# Patient Record
Sex: Female | Born: 1935 | Race: White | Hispanic: No | Marital: Single
Health system: Southern US, Community
[De-identification: ages and names within clinical notes are randomized; demographics above are authoritative.]

---

## 2010-04-07 ENCOUNTER — Ambulatory Visit: Payer: Self-pay

## 2010-04-09 ENCOUNTER — Ambulatory Visit: Payer: Self-pay | Admitting: Oncology

## 2010-04-11 ENCOUNTER — Ambulatory Visit: Payer: Self-pay | Admitting: Oncology

## 2010-04-14 ENCOUNTER — Ambulatory Visit: Payer: Self-pay | Admitting: General Surgery

## 2010-04-15 ENCOUNTER — Ambulatory Visit: Payer: Self-pay | Admitting: Oncology

## 2010-04-23 ENCOUNTER — Ambulatory Visit: Payer: Self-pay | Admitting: Oncology

## 2010-04-28 LAB — PATHOLOGY REPORT

## 2010-05-11 ENCOUNTER — Ambulatory Visit: Payer: Self-pay | Admitting: Oncology

## 2010-06-11 ENCOUNTER — Ambulatory Visit: Payer: Self-pay | Admitting: Oncology

## 2010-07-07 ENCOUNTER — Ambulatory Visit: Payer: Self-pay | Admitting: General Surgery

## 2010-07-11 ENCOUNTER — Ambulatory Visit: Payer: Self-pay | Admitting: Oncology

## 2010-08-11 ENCOUNTER — Ambulatory Visit: Payer: Self-pay | Admitting: Oncology

## 2010-08-17 ENCOUNTER — Ambulatory Visit: Payer: Self-pay | Admitting: Oncology

## 2010-09-11 ENCOUNTER — Ambulatory Visit: Payer: Self-pay | Admitting: Oncology

## 2010-10-11 ENCOUNTER — Ambulatory Visit: Payer: Self-pay | Admitting: Oncology

## 2010-11-11 ENCOUNTER — Ambulatory Visit: Payer: Self-pay | Admitting: Oncology

## 2010-12-23 ENCOUNTER — Ambulatory Visit: Payer: Self-pay | Admitting: Oncology

## 2011-01-11 ENCOUNTER — Ambulatory Visit: Payer: Self-pay | Admitting: Oncology

## 2011-01-18 ENCOUNTER — Ambulatory Visit: Payer: Self-pay | Admitting: Urology

## 2011-02-15 ENCOUNTER — Ambulatory Visit: Payer: Self-pay | Admitting: Oncology

## 2011-02-15 LAB — COMPREHENSIVE METABOLIC PANEL
Alkaline Phosphatase: 170 U/L — ABNORMAL HIGH (ref 50–136)
BUN: 8 mg/dL (ref 7–18)
Bilirubin,Total: 0.5 mg/dL (ref 0.2–1.0)
Chloride: 92 mmol/L — ABNORMAL LOW (ref 98–107)
Co2: 26 mmol/L (ref 21–32)
Creatinine: 0.76 mg/dL (ref 0.60–1.30)
EGFR (African American): >60
EGFR (Non-African Amer.): >60
Potassium: 4.2 mmol/L (ref 3.5–5.1)
SGOT(AST): 26 U/L (ref 15–37)
SGPT (ALT): 35 U/L
Total Protein: 7.9 g/dL (ref 6.4–8.2)

## 2011-02-15 LAB — CBC CANCER CENTER
Basophil #: 0 x10 3/mm (ref 0.0–0.1)
Eosinophil #: 0.1 x10 3/mm (ref 0.0–0.7)
Eosinophil %: 2.8 %
HGB: 12.6 g/dL (ref 12.0–16.0)
Lymphocyte #: 0.5 x10 3/mm — ABNORMAL LOW (ref 1.0–3.6)
MCHC: 35.3 g/dL (ref 32.0–36.0)
Monocyte #: 0.1 x10 3/mm (ref 0.0–0.7)
Monocyte %: 6.3 %
Neutrophil #: 1.1 x10 3/mm — ABNORMAL LOW (ref 1.4–6.5)
Neutrophil %: 60.6 %
Platelet: 314 x10 3/mm (ref 150–440)
RBC: 4.35 10*6/uL (ref 3.80–5.20)
WBC: 1.8 x10 3/mm — CL (ref 3.6–11.0)

## 2011-02-17 LAB — CBC CANCER CENTER
Basophil %: 0.8 %
Eosinophil %: 2.3 %
HCT: 33.5 % — ABNORMAL LOW (ref 35.0–47.0)
HGB: 11.8 g/dL — ABNORMAL LOW (ref 12.0–16.0)
Lymphocyte %: 28.6 %
MCHC: 35.2 g/dL (ref 32.0–36.0)
Monocyte %: 7.2 %
Neutrophil #: 1.3 x10 3/mm — ABNORMAL LOW (ref 1.4–6.5)

## 2011-02-19 ENCOUNTER — Inpatient Hospital Stay: Payer: Self-pay | Admitting: Internal Medicine

## 2011-02-19 LAB — COMPREHENSIVE METABOLIC PANEL
Alkaline Phosphatase: 104 U/L (ref 50–136)
Anion Gap: 10 (ref 7–16)
BUN: 6 mg/dL — ABNORMAL LOW (ref 7–18)
Bilirubin,Total: 0.4 mg/dL (ref 0.2–1.0)
Calcium, Total: 9.5 mg/dL (ref 8.5–10.1)
Chloride: 99 mmol/L (ref 98–107)
Co2: 26 mmol/L (ref 21–32)
Potassium: 4 mmol/L (ref 3.5–5.1)
SGOT(AST): 20 U/L (ref 15–37)
SGPT (ALT): 22 U/L
Total Protein: 6.6 g/dL (ref 6.4–8.2)

## 2011-02-19 LAB — CBC WITH DIFFERENTIAL/PLATELET
Basophil #: 0 10*3/uL (ref 0.0–0.1)
Eosinophil #: 0 10*3/uL (ref 0.0–0.7)
Eosinophil %: 0 %
HCT: 29.8 % — ABNORMAL LOW (ref 35.0–47.0)
Lymphocyte #: 0.3 10*3/uL — ABNORMAL LOW (ref 1.0–3.6)
MCHC: 34.4 g/dL (ref 32.0–36.0)
MCV: 83 fL (ref 80–100)
Monocyte #: 0.2 10*3/uL (ref 0.0–0.7)
Neutrophil #: 0.9 10*3/uL — ABNORMAL LOW (ref 1.4–6.5)
Neutrophil %: 66.3 %
RDW: 12.9 % (ref 11.5–14.5)
WBC: 1.4 10*3/uL — CL (ref 3.6–11.0)

## 2011-02-19 LAB — URINALYSIS, COMPLETE
Blood: NEGATIVE
Ketone: NEGATIVE
Nitrite: NEGATIVE
Ph: 6 (ref 4.5–8.0)
Protein: NEGATIVE
RBC,UR: NONE SEEN /HPF (ref 0–5)
Specific Gravity: 1.001 (ref 1.003–1.030)

## 2011-02-20 LAB — CBC WITH DIFFERENTIAL/PLATELET
Basophil %: 1.7 %
Eosinophil #: 0.1 10*3/uL (ref 0.0–0.7)
Eosinophil %: 4.7 %
HGB: 9.6 g/dL — ABNORMAL LOW (ref 12.0–16.0)
Lymphocyte #: 0.4 10*3/uL — ABNORMAL LOW (ref 1.0–3.6)
MCH: 28.4 pg (ref 26.0–34.0)
MCHC: 34.2 g/dL (ref 32.0–36.0)
MCV: 83 fL (ref 80–100)
Monocyte #: 0.1 10*3/uL (ref 0.0–0.7)
Monocyte %: 10.9 %
Neutrophil %: 49 %
Platelet: 236 10*3/uL (ref 150–440)
RBC: 3.4 10*6/uL — ABNORMAL LOW (ref 3.80–5.20)
RDW: 12.9 % (ref 11.5–14.5)
WBC: 1.3 10*3/uL — CL (ref 3.6–11.0)

## 2011-02-20 LAB — URINE CULTURE

## 2011-02-21 LAB — CBC WITH DIFFERENTIAL/PLATELET
Basophil %: 1.3 %
Basophil: 5 %
Eosinophil #: 0.1 10*3/uL (ref 0.0–0.7)
Eosinophil %: 7 %
HCT: 25.5 % — ABNORMAL LOW (ref 35.0–47.0)
Lymphocyte %: 29.9 %
Lymphocytes: 29 %
MCH: 28.5 pg (ref 26.0–34.0)
MCHC: 34.4 g/dL (ref 32.0–36.0)
MCV: 83 fL (ref 80–100)
Monocyte #: 0.1 10*3/uL (ref 0.0–0.7)
Neutrophil #: 0.5 10*3/uL — ABNORMAL LOW (ref 1.4–6.5)
RDW: 12.6 % (ref 11.5–14.5)

## 2011-02-22 LAB — CBC WITH DIFFERENTIAL/PLATELET
Basophil #: 0 10*3/uL (ref 0.0–0.1)
Basophil %: 1.5 %
Eosinophil #: 0.1 10*3/uL (ref 0.0–0.7)
HCT: 28 % — ABNORMAL LOW (ref 35.0–47.0)
HGB: 9.7 g/dL — ABNORMAL LOW (ref 12.0–16.0)
Lymphocyte %: 35.9 %
MCH: 28.5 pg (ref 26.0–34.0)
MCHC: 34.5 g/dL (ref 32.0–36.0)
Monocyte #: 0.1 10*3/uL (ref 0.0–0.7)
Monocyte %: 9.7 %
Neutrophil %: 47.2 %
Platelet: 244 10*3/uL (ref 150–440)
RBC: 3.38 10*6/uL — ABNORMAL LOW (ref 3.80–5.20)

## 2011-02-22 LAB — PROTIME-INR
INR: 1.1
Prothrombin Time: 14.8 secs — ABNORMAL HIGH (ref 11.5–14.7)

## 2011-02-22 LAB — CREATININE, SERUM
EGFR (African American): >60
EGFR (Non-African Amer.): >60

## 2011-02-23 LAB — CBC WITH DIFFERENTIAL/PLATELET
Basophil #: 0 10*3/uL (ref 0.0–0.1)
Eosinophil #: 0.1 10*3/uL (ref 0.0–0.7)
MCH: 28.2 pg (ref 26.0–34.0)
MCHC: 34.4 g/dL (ref 32.0–36.0)
Monocyte #: 0.1 10*3/uL (ref 0.0–0.7)
Neutrophil %: 48.6 %
Platelet: 260 10*3/uL (ref 150–440)
RDW: 12.9 % (ref 11.5–14.5)
WBC: 1.2 10*3/uL — CL (ref 3.6–11.0)

## 2011-02-23 LAB — CLOSTRIDIUM DIFFICILE BY PCR

## 2011-02-23 LAB — PATHOLOGY REPORT

## 2011-02-23 LAB — WBCS, STOOL

## 2011-02-24 LAB — COMPREHENSIVE METABOLIC PANEL
Alkaline Phosphatase: 88 U/L (ref 50–136)
Anion Gap: 12 (ref 7–16)
Chloride: 107 mmol/L (ref 98–107)
Co2: 24 mmol/L (ref 21–32)
Creatinine: 0.65 mg/dL (ref 0.60–1.30)
EGFR (African American): >60
EGFR (Non-African Amer.): >60
Osmolality: 283 (ref 275–301)
Sodium: 143 mmol/L (ref 136–145)

## 2011-02-24 LAB — CBC WITH DIFFERENTIAL/PLATELET
Eosinophil #: 0.1 10*3/uL (ref 0.0–0.7)
Eosinophil %: 5.7 %
Lymphocyte #: 0.3 10*3/uL — ABNORMAL LOW (ref 1.0–3.6)
MCHC: 34.1 g/dL (ref 32.0–36.0)
MCV: 82 fL (ref 80–100)
Monocyte %: 14 %
Platelet: 266 10*3/uL (ref 150–440)
RBC: 3.54 10*6/uL — ABNORMAL LOW (ref 3.80–5.20)
RDW: 12.6 % (ref 11.5–14.5)
WBC: 1.1 10*3/uL — CL (ref 3.6–11.0)

## 2011-02-25 LAB — CBC WITH DIFFERENTIAL/PLATELET
Basophil #: 0 10*3/uL (ref 0.0–0.1)
Eosinophil #: 0.1 10*3/uL (ref 0.0–0.7)
HCT: 29.1 % — ABNORMAL LOW (ref 35.0–47.0)
HGB: 9.9 g/dL — ABNORMAL LOW (ref 12.0–16.0)
Lymphocyte #: 0.4 10*3/uL — ABNORMAL LOW (ref 1.0–3.6)
Lymphocyte %: 30.7 %
MCH: 28.1 pg (ref 26.0–34.0)
MCHC: 34.1 g/dL (ref 32.0–36.0)
MCV: 83 fL (ref 80–100)
Monocyte #: 0.2 10*3/uL (ref 0.0–0.7)
Monocyte %: 13.9 %
Neutrophil #: 0.6 10*3/uL — ABNORMAL LOW (ref 1.4–6.5)
RBC: 3.52 10*6/uL — ABNORMAL LOW (ref 3.80–5.20)
WBC: 1.3 10*3/uL — CL (ref 3.6–11.0)

## 2011-02-27 LAB — CBC WITH DIFFERENTIAL/PLATELET
Basophil #: 0 10*3/uL (ref 0.0–0.1)
Eosinophil #: 0.1 10*3/uL (ref 0.0–0.7)
Eosinophil %: 4.9 %
HCT: 29.2 % — ABNORMAL LOW (ref 35.0–47.0)
Lymphocyte #: 0.3 10*3/uL — ABNORMAL LOW (ref 1.0–3.6)
Lymphocyte %: 21.8 %
MCHC: 33.5 g/dL (ref 32.0–36.0)
MCV: 83 fL (ref 80–100)
Neutrophil #: 0.8 10*3/uL — ABNORMAL LOW (ref 1.4–6.5)
Neutrophil %: 56.1 %
Platelet: 214 10*3/uL (ref 150–440)
RBC: 3.53 10*6/uL — ABNORMAL LOW (ref 3.80–5.20)
RDW: 13.2 % (ref 11.5–14.5)

## 2011-02-27 LAB — CK TOTAL AND CKMB (NOT AT ARMC)
CK, Total: 14 U/L — ABNORMAL LOW (ref 21–215)
CK, Total: 13 U/L — ABNORMAL LOW (ref 21–215)
CK-MB: <0.5 ng/mL — ABNORMAL LOW (ref 0.5–3.6)
CK-MB: <0.5 ng/mL — ABNORMAL LOW (ref 0.5–3.6)

## 2011-02-27 LAB — TROPONIN I: Troponin-I: <0.02 ng/mL

## 2011-02-27 LAB — COMPREHENSIVE METABOLIC PANEL
Albumin: 2.9 g/dL — ABNORMAL LOW (ref 3.4–5.0)
Alkaline Phosphatase: 79 U/L (ref 50–136)
Anion Gap: 9 (ref 7–16)
BUN: 10 mg/dL (ref 7–18)
Calcium, Total: 9.7 mg/dL (ref 8.5–10.1)
Glucose: 90 mg/dL (ref 65–99)
Potassium: 3.8 mmol/L (ref 3.5–5.1)
SGOT(AST): 22 U/L (ref 15–37)
SGPT (ALT): 25 U/L
Sodium: 139 mmol/L (ref 136–145)

## 2011-02-28 LAB — CBC WITH DIFFERENTIAL/PLATELET
Basophil #: 0 10*3/uL (ref 0.0–0.1)
Basophil %: 0.9 %
Eosinophil #: 0.1 10*3/uL (ref 0.0–0.7)
HCT: 29.5 % — ABNORMAL LOW (ref 35.0–47.0)
Lymphocyte #: 0.4 10*3/uL — ABNORMAL LOW (ref 1.0–3.6)
MCH: 28 pg (ref 26.0–34.0)
MCHC: 33.6 g/dL (ref 32.0–36.0)
MCV: 83 fL (ref 80–100)
Monocyte #: 0.2 10*3/uL (ref 0.0–0.7)
Monocyte %: 21 %
Neutrophil #: 0.5 10*3/uL — ABNORMAL LOW (ref 1.4–6.5)
Platelet: 200 10*3/uL (ref 150–440)
RDW: 13.7 % (ref 11.5–14.5)

## 2011-02-28 LAB — CK TOTAL AND CKMB (NOT AT ARMC)
CK, Total: 12 U/L — ABNORMAL LOW (ref 21–215)
CK-MB: <0.5 ng/mL — ABNORMAL LOW (ref 0.5–3.6)

## 2011-02-28 LAB — TROPONIN I: Troponin-I: <0.02 ng/mL

## 2011-03-02 LAB — CBC CANCER CENTER
Bands: 12 %
Blast: 1 %
Eosinophil: 1 %
HCT: 31.8 % — ABNORMAL LOW (ref 35.0–47.0)
HGB: 11.1 g/dL — ABNORMAL LOW (ref 12.0–16.0)
MCH: 28.6 pg (ref 26.0–34.0)
MCHC: 34.7 g/dL (ref 32.0–36.0)
Metamyelocyte: 4 %
Monocytes: 30 %
Segmented Neutrophils: 18 %
Variant Lymphocyte: 4 %

## 2011-03-02 LAB — COMPREHENSIVE METABOLIC PANEL
Alkaline Phosphatase: 126 U/L (ref 50–136)
BUN: 7 mg/dL (ref 7–18)
Bilirubin,Total: 0.3 mg/dL (ref 0.2–1.0)
Creatinine: 0.93 mg/dL (ref 0.60–1.30)
EGFR (African American): >60
EGFR (Non-African Amer.): >60
Glucose: 98 mg/dL (ref 65–99)
Sodium: 136 mmol/L (ref 136–145)
Total Protein: 6.7 g/dL (ref 6.4–8.2)

## 2011-03-09 LAB — CBC CANCER CENTER
Basophil %: 1.5 %
Eosinophil %: 1.5 %
HCT: 31.7 % — ABNORMAL LOW (ref 35.0–47.0)
HGB: 11 g/dL — ABNORMAL LOW (ref 12.0–16.0)
Lymphocyte %: 14.3 %
MCH: 28.1 pg (ref 26.0–34.0)
MCV: 81 fL (ref 80–100)
Monocyte #: 0.2 x10 3/mm (ref 0.0–0.7)
Neutrophil %: 73.2 %
RBC: 3.91 10*6/uL (ref 3.80–5.20)
WBC: 2.3 x10 3/mm — ABNORMAL LOW (ref 3.6–11.0)

## 2011-03-09 LAB — COMPREHENSIVE METABOLIC PANEL
Albumin: 3.3 g/dL — ABNORMAL LOW (ref 3.4–5.0)
Alkaline Phosphatase: 141 U/L — ABNORMAL HIGH (ref 50–136)
Anion Gap: 14 (ref 7–16)
BUN: 6 mg/dL — ABNORMAL LOW (ref 7–18)
Bilirubin,Total: 0.3 mg/dL (ref 0.2–1.0)
Calcium, Total: 9.1 mg/dL (ref 8.5–10.1)
Chloride: 103 mmol/L (ref 98–107)
Co2: 22 mmol/L (ref 21–32)
EGFR (African American): >60
Glucose: 91 mg/dL (ref 65–99)
Osmolality: 275 (ref 275–301)
Potassium: 3.5 mmol/L (ref 3.5–5.1)
SGOT(AST): 28 U/L (ref 15–37)
SGPT (ALT): 28 U/L
Sodium: 139 mmol/L (ref 136–145)
Total Protein: 7.1 g/dL (ref 6.4–8.2)

## 2011-03-11 ENCOUNTER — Ambulatory Visit: Payer: Self-pay | Admitting: Oncology

## 2011-03-16 LAB — BASIC METABOLIC PANEL
Calcium, Total: 9.1 mg/dL (ref 8.5–10.1)
Co2: 23 mmol/L (ref 21–32)
EGFR (African American): >60
EGFR (Non-African Amer.): >60
Glucose: 106 mg/dL — ABNORMAL HIGH (ref 65–99)
Osmolality: 270 (ref 275–301)
Sodium: 136 mmol/L (ref 136–145)

## 2011-03-16 LAB — CBC CANCER CENTER
Basophil #: 0 x10 3/mm (ref 0.0–0.1)
Basophil %: 2 %
Eosinophil #: 0 x10 3/mm (ref 0.0–0.7)
Eosinophil %: 1.8 %
HCT: 30.8 % — ABNORMAL LOW (ref 35.0–47.0)
HGB: 10.7 g/dL — ABNORMAL LOW (ref 12.0–16.0)
Lymphocyte #: 0.3 x10 3/mm — ABNORMAL LOW (ref 1.0–3.6)
Lymphocyte %: 23.9 %
MCH: 28.1 pg (ref 26.0–34.0)
MCHC: 34.9 g/dL (ref 32.0–36.0)
Monocyte #: 0.2 x10 3/mm (ref 0.0–0.7)
Monocyte %: 11.7 %
Neutrophil #: 0.9 x10 3/mm — ABNORMAL LOW (ref 1.4–6.5)
Platelet: 83 x10 3/mm — ABNORMAL LOW (ref 150–440)
RDW: 14.1 % (ref 11.5–14.5)
WBC: 1.4 x10 3/mm — CL (ref 3.6–11.0)

## 2011-03-23 LAB — CBC CANCER CENTER
Basophil %: 1.8 %
Eosinophil #: 0.1 x10 3/mm (ref 0.0–0.7)
HGB: 9.8 g/dL — ABNORMAL LOW (ref 12.0–16.0)
Lymphocyte #: 0.3 x10 3/mm — ABNORMAL LOW (ref 1.0–3.6)
MCH: 27.8 pg (ref 26.0–34.0)
MCHC: 34.9 g/dL (ref 32.0–36.0)
MCV: 80 fL (ref 80–100)
Monocyte #: 0.2 x10 3/mm (ref 0.0–0.7)
Neutrophil #: 0.4 x10 3/mm — ABNORMAL LOW (ref 1.4–6.5)
RBC: 3.52 10*6/uL — ABNORMAL LOW (ref 3.80–5.20)
WBC: 1 x10 3/mm — CL (ref 3.6–11.0)

## 2011-03-30 LAB — CBC CANCER CENTER
Basophil %: 0.8 %
Eosinophil %: 2.2 %
HCT: 26.9 % — ABNORMAL LOW (ref 35.0–47.0)
HGB: 9.2 g/dL — ABNORMAL LOW (ref 12.0–16.0)
Lymphocyte %: 20.5 %
MCHC: 34.1 g/dL (ref 32.0–36.0)
MCV: 79 fL — ABNORMAL LOW (ref 80–100)
Neutrophil %: 62.3 %
RBC: 3.4 10*6/uL — ABNORMAL LOW (ref 3.80–5.20)

## 2011-04-11 ENCOUNTER — Ambulatory Visit: Payer: Self-pay | Admitting: Oncology

## 2011-04-18 LAB — CBC CANCER CENTER
Basophil #: 0 x10 3/mm (ref 0.0–0.1)
Eosinophil #: 0 x10 3/mm (ref 0.0–0.7)
Eosinophil %: 3.7 %
HCT: 29.6 % — ABNORMAL LOW (ref 35.0–47.0)
MCH: 27.1 pg (ref 26.0–34.0)
MCV: 79 fL — ABNORMAL LOW (ref 80–100)
Monocyte %: 21.5 %
Neutrophil #: 0.5 x10 3/mm — ABNORMAL LOW (ref 1.4–6.5)
Neutrophil %: 36.5 %
RBC: 3.77 10*6/uL — ABNORMAL LOW (ref 3.80–5.20)
RDW: 16.8 % — ABNORMAL HIGH (ref 11.5–14.5)
WBC: 1.3 x10 3/mm — CL (ref 3.6–11.0)

## 2011-04-25 LAB — CBC CANCER CENTER
Basophil #: 0 x10 3/mm (ref 0.0–0.1)
Basophil %: 2.1 %
Eosinophil #: 0 x10 3/mm (ref 0.0–0.7)
Eosinophil %: 2.4 %
Lymphocyte #: 0.4 x10 3/mm — ABNORMAL LOW (ref 1.0–3.6)
Lymphocyte %: 30.8 %
MCHC: 33.1 g/dL (ref 32.0–36.0)
MCV: 79 fL — ABNORMAL LOW (ref 80–100)
Monocyte #: 0.3 x10 3/mm (ref 0.2–0.9)
Monocyte %: 20.7 %
Neutrophil %: 44 %
RBC: 3.72 10*6/uL — ABNORMAL LOW (ref 3.80–5.20)

## 2011-05-02 LAB — CBC CANCER CENTER
Basophil #: 0 x10 3/mm (ref 0.0–0.1)
Eosinophil %: 2 %
HCT: 28.3 % — ABNORMAL LOW (ref 35.0–47.0)
HGB: 9.4 g/dL — ABNORMAL LOW (ref 12.0–16.0)
Lymphocyte #: 0.3 x10 3/mm — ABNORMAL LOW (ref 1.0–3.6)
Lymphocyte %: 30.4 %
MCH: 25.6 pg — ABNORMAL LOW (ref 26.0–34.0)
MCHC: 33.1 g/dL (ref 32.0–36.0)
Monocyte %: 12.3 %
Neutrophil #: 0.5 x10 3/mm — ABNORMAL LOW (ref 1.4–6.5)
Platelet: 67 x10 3/mm — ABNORMAL LOW (ref 150–440)
RBC: 3.66 10*6/uL — ABNORMAL LOW (ref 3.80–5.20)
WBC: 0.9 x10 3/mm — CL (ref 3.6–11.0)

## 2011-05-09 LAB — CBC CANCER CENTER
Basophil #: 0 x10 3/mm (ref 0.0–0.1)
Basophil %: 1.2 %
Eosinophil %: 2.3 %
HGB: 9 g/dL — ABNORMAL LOW (ref 12.0–16.0)
MCH: 25.6 pg — ABNORMAL LOW (ref 26.0–34.0)
MCHC: 33.2 g/dL (ref 32.0–36.0)
MCV: 77 fL — ABNORMAL LOW (ref 80–100)
Monocyte #: 0.3 x10 3/mm (ref 0.2–0.9)
Monocyte %: 20.9 %
Neutrophil %: 51.1 %
Platelet: 223 x10 3/mm (ref 150–440)
RBC: 3.53 10*6/uL — ABNORMAL LOW (ref 3.80–5.20)
RDW: 17.2 % — ABNORMAL HIGH (ref 11.5–14.5)
WBC: 1.3 x10 3/mm — CL (ref 3.6–11.0)

## 2011-05-09 LAB — COMPREHENSIVE METABOLIC PANEL
Anion Gap: 11 (ref 7–16)
BUN: 11 mg/dL (ref 7–18)
Bilirubin,Total: 0.2 mg/dL (ref 0.2–1.0)
Calcium, Total: 9.6 mg/dL (ref 8.5–10.1)
Potassium: 4.2 mmol/L (ref 3.5–5.1)
Sodium: 135 mmol/L — ABNORMAL LOW (ref 136–145)
Total Protein: 6.4 g/dL (ref 6.4–8.2)

## 2011-05-11 ENCOUNTER — Ambulatory Visit: Payer: Self-pay | Admitting: Oncology

## 2011-05-16 LAB — COMPREHENSIVE METABOLIC PANEL
Alkaline Phosphatase: 114 U/L (ref 50–136)
Anion Gap: 9 (ref 7–16)
BUN: 9 mg/dL (ref 7–18)
Bilirubin,Total: 0.3 mg/dL (ref 0.2–1.0)
Calcium, Total: 8.6 mg/dL (ref 8.5–10.1)
Chloride: 99 mmol/L (ref 98–107)
Co2: 26 mmol/L (ref 21–32)
Creatinine: 0.63 mg/dL (ref 0.60–1.30)
EGFR (African American): >60
Glucose: 98 mg/dL (ref 65–99)
Osmolality: 267 (ref 275–301)
Potassium: 3.7 mmol/L (ref 3.5–5.1)
SGOT(AST): 27 U/L (ref 15–37)
Sodium: 134 mmol/L — ABNORMAL LOW (ref 136–145)
Total Protein: 6.2 g/dL — ABNORMAL LOW (ref 6.4–8.2)

## 2011-05-16 LAB — CBC CANCER CENTER
Basophil %: 1.5 %
Eosinophil #: 0 x10 3/mm (ref 0.0–0.7)
Eosinophil %: 1.1 %
Lymphocyte %: 21.1 %
MCHC: 32.6 g/dL (ref 32.0–36.0)
MCV: 78 fL — ABNORMAL LOW (ref 80–100)
Monocyte %: 21.3 %
Neutrophil %: 55 %
RDW: 18.5 % — ABNORMAL HIGH (ref 11.5–14.5)

## 2011-05-30 LAB — CBC CANCER CENTER
Basophil #: 0 x10 3/mm (ref 0.0–0.1)
Eosinophil %: 1 %
HCT: 31.2 % — ABNORMAL LOW (ref 35.0–47.0)
HGB: 10.1 g/dL — ABNORMAL LOW (ref 12.0–16.0)
Lymphocyte %: 10 %
MCH: 25.6 pg — ABNORMAL LOW (ref 26.0–34.0)
MCHC: 32.5 g/dL (ref 32.0–36.0)
MCV: 79 fL — ABNORMAL LOW (ref 80–100)
Monocyte #: 0.6 x10 3/mm (ref 0.2–0.9)
Monocyte %: 13.1 %
Neutrophil %: 75 %
Platelet: 208 x10 3/mm (ref 150–440)
RBC: 3.96 10*6/uL (ref 3.80–5.20)
RDW: 19.4 % — ABNORMAL HIGH (ref 11.5–14.5)
WBC: 4.9 x10 3/mm (ref 3.6–11.0)

## 2011-05-30 LAB — COMPREHENSIVE METABOLIC PANEL
BUN: 12 mg/dL (ref 7–18)
Calcium, Total: 9.3 mg/dL (ref 8.5–10.1)
Chloride: 100 mmol/L (ref 98–107)
Creatinine: 0.63 mg/dL (ref 0.60–1.30)
EGFR (African American): >60
EGFR (Non-African Amer.): >60
SGOT(AST): 23 U/L (ref 15–37)
SGPT (ALT): 37 U/L

## 2011-06-07 LAB — CBC CANCER CENTER
Basophil #: 0 x10 3/mm (ref 0.0–0.1)
Basophil %: 0.7 %
HGB: 11.1 g/dL — ABNORMAL LOW (ref 12.0–16.0)
Lymphocyte #: 0.5 x10 3/mm — ABNORMAL LOW (ref 1.0–3.6)
Lymphocyte %: 7.4 %
MCH: 26 pg (ref 26.0–34.0)
MCHC: 32.5 g/dL (ref 32.0–36.0)
MCV: 80 fL (ref 80–100)
Monocyte #: 1 x10 3/mm — ABNORMAL HIGH (ref 0.2–0.9)
Monocyte %: 12.9 %
Neutrophil %: 77.3 %
RBC: 4.25 10*6/uL (ref 3.80–5.20)
RDW: 19.4 % — ABNORMAL HIGH (ref 11.5–14.5)
WBC: 7.4 x10 3/mm (ref 3.6–11.0)

## 2011-06-07 LAB — COMPREHENSIVE METABOLIC PANEL
Alkaline Phosphatase: 111 U/L (ref 50–136)
Calcium, Total: 9.4 mg/dL (ref 8.5–10.1)
Chloride: 101 mmol/L (ref 98–107)
Creatinine: 0.78 mg/dL (ref 0.60–1.30)
EGFR (Non-African Amer.): >60
Osmolality: 275 (ref 275–301)
Potassium: 3.7 mmol/L (ref 3.5–5.1)
SGOT(AST): 21 U/L (ref 15–37)
Sodium: 137 mmol/L (ref 136–145)
Total Protein: 6.9 g/dL (ref 6.4–8.2)

## 2011-06-11 ENCOUNTER — Ambulatory Visit: Payer: Self-pay | Admitting: Oncology

## 2011-06-14 LAB — BASIC METABOLIC PANEL
BUN: 11 mg/dL (ref 7–18)
Calcium, Total: 9.2 mg/dL (ref 8.5–10.1)
Creatinine: 0.71 mg/dL (ref 0.60–1.30)
EGFR (African American): >60
Glucose: 135 mg/dL — ABNORMAL HIGH (ref 65–99)
Osmolality: 275 (ref 275–301)
Sodium: 137 mmol/L (ref 136–145)

## 2011-06-14 LAB — CBC CANCER CENTER
Basophil #: 0 x10 3/mm (ref 0.0–0.1)
Basophil %: 1.1 %
Eosinophil #: 0.1 x10 3/mm (ref 0.0–0.7)
Eosinophil %: 3.6 %
HCT: 34.1 % — ABNORMAL LOW (ref 35.0–47.0)
HGB: 11.2 g/dL — ABNORMAL LOW (ref 12.0–16.0)
Lymphocyte #: 0.5 x10 3/mm — ABNORMAL LOW (ref 1.0–3.6)
Lymphocyte %: 14.2 %
MCH: 26.1 pg (ref 26.0–34.0)
MCV: 80 fL (ref 80–100)
Monocyte #: 0.5 x10 3/mm (ref 0.2–0.9)
Platelet: 159 x10 3/mm (ref 150–440)
RBC: 4.28 10*6/uL (ref 3.80–5.20)
RDW: 19.4 % — ABNORMAL HIGH (ref 11.5–14.5)
WBC: 3.7 x10 3/mm (ref 3.6–11.0)

## 2011-06-28 LAB — CBC CANCER CENTER
Basophil #: 0 x10 3/mm (ref 0.0–0.1)
Basophil %: 0.8 %
Eosinophil %: 1.7 %
HGB: 10.8 g/dL — ABNORMAL LOW (ref 12.0–16.0)
MCHC: 32.5 g/dL (ref 32.0–36.0)
MCV: 79 fL — ABNORMAL LOW (ref 80–100)
RBC: 4.17 10*6/uL (ref 3.80–5.20)
RDW: 18.3 % — ABNORMAL HIGH (ref 11.5–14.5)

## 2011-06-28 LAB — COMPREHENSIVE METABOLIC PANEL
Albumin: 3.6 g/dL (ref 3.4–5.0)
Alkaline Phosphatase: 117 U/L (ref 50–136)
BUN: 7 mg/dL (ref 7–18)
Bilirubin,Total: 0.2 mg/dL (ref 0.2–1.0)
Calcium, Total: 9.2 mg/dL (ref 8.5–10.1)
Chloride: 103 mmol/L (ref 98–107)
Co2: 26 mmol/L (ref 21–32)
EGFR (Non-African Amer.): >60
Glucose: 96 mg/dL (ref 65–99)
Potassium: 3.6 mmol/L (ref 3.5–5.1)
SGPT (ALT): 37 U/L
Sodium: 138 mmol/L (ref 136–145)
Total Protein: 6.9 g/dL (ref 6.4–8.2)

## 2011-07-11 ENCOUNTER — Ambulatory Visit: Payer: Self-pay | Admitting: Oncology

## 2011-07-12 LAB — CBC CANCER CENTER
Basophil %: 1.3 %
HCT: 33.4 % — ABNORMAL LOW (ref 35.0–47.0)
HGB: 11 g/dL — ABNORMAL LOW (ref 12.0–16.0)
Lymphocyte %: 18.9 %
MCHC: 32.9 g/dL (ref 32.0–36.0)
MCV: 79 fL — ABNORMAL LOW (ref 80–100)
Monocyte #: 0.7 x10 3/mm (ref 0.2–0.9)
Monocyte %: 16.5 %
Neutrophil #: 2.4 x10 3/mm (ref 1.4–6.5)
Platelet: 166 x10 3/mm (ref 150–440)
WBC: 4 x10 3/mm (ref 3.6–11.0)

## 2011-07-12 LAB — COMPREHENSIVE METABOLIC PANEL
Anion Gap: 13 (ref 7–16)
BUN: 10 mg/dL (ref 7–18)
Bilirubin,Total: 0.3 mg/dL (ref 0.2–1.0)
Chloride: 101 mmol/L (ref 98–107)
Co2: 23 mmol/L (ref 21–32)
EGFR (African American): >60
EGFR (Non-African Amer.): >60
Potassium: 3.6 mmol/L (ref 3.5–5.1)
SGOT(AST): 25 U/L (ref 15–37)
Sodium: 137 mmol/L (ref 136–145)
Total Protein: 6.9 g/dL (ref 6.4–8.2)

## 2011-08-11 ENCOUNTER — Ambulatory Visit: Payer: Self-pay | Admitting: Oncology

## 2012-03-10 DEATH — deceased

## 2012-06-08 IMAGING — CT CT ABDOMEN WO/W CM
1 series · 15 of 32 positions shown, 19 images · non-contrast
Comparison: none

REASON FOR EXAM: rt renal mass
COMMENTS:

[Series 2: renal wo 3.0 i40f 3 · axial · 0.82mm/px · z∈[-53,+178]mm · 15 of 86 slices shown, 19 images]
[im 6/86  soft-tissue]
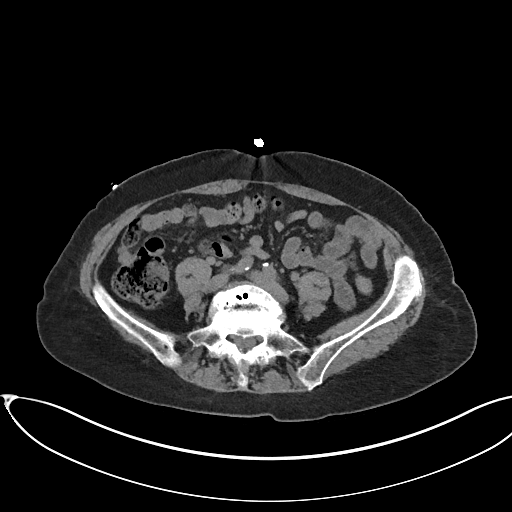
[im 6/86  bone]
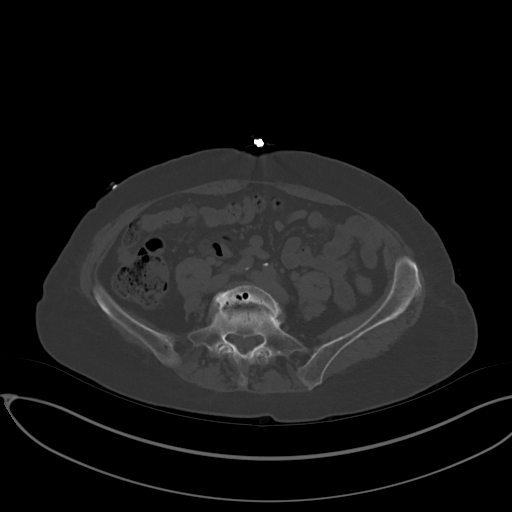
[im 11/86  soft-tissue]
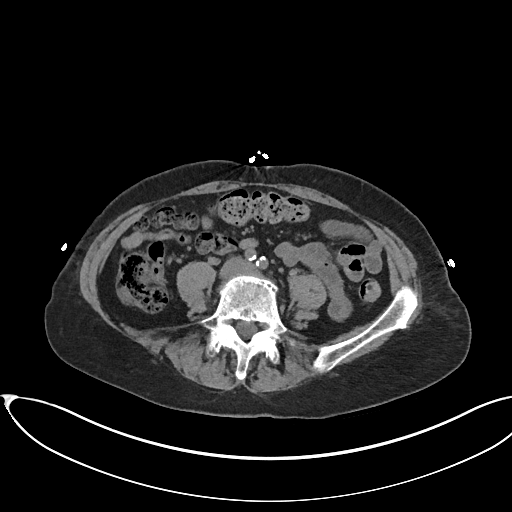
[im 17/86  soft-tissue]
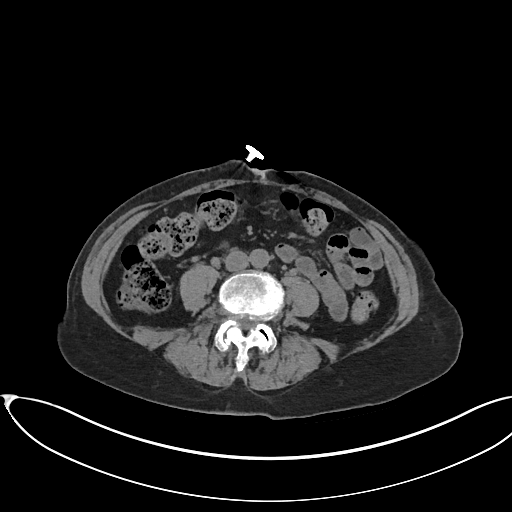
[im 25/86  soft-tissue]
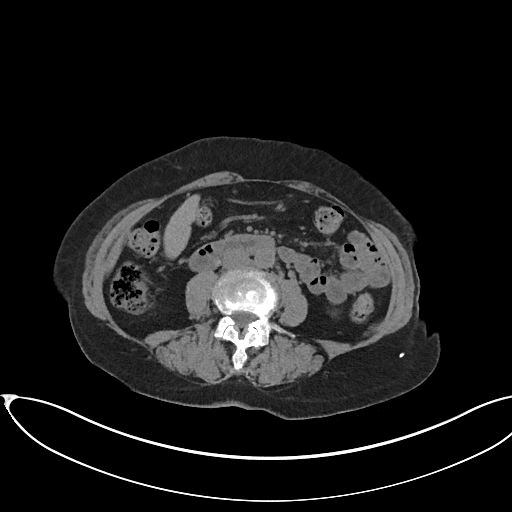
[im 31/86  soft-tissue]
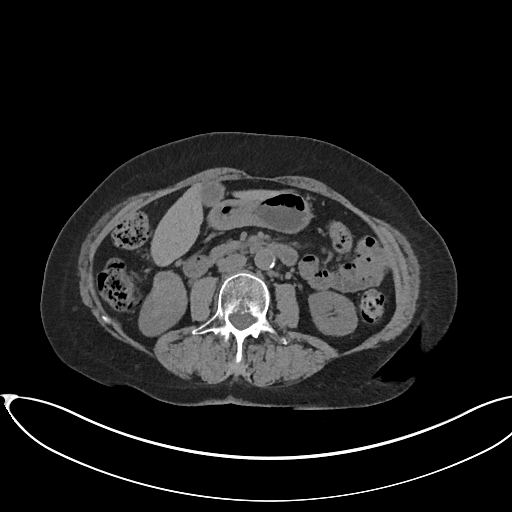
[im 36/86  soft-tissue]
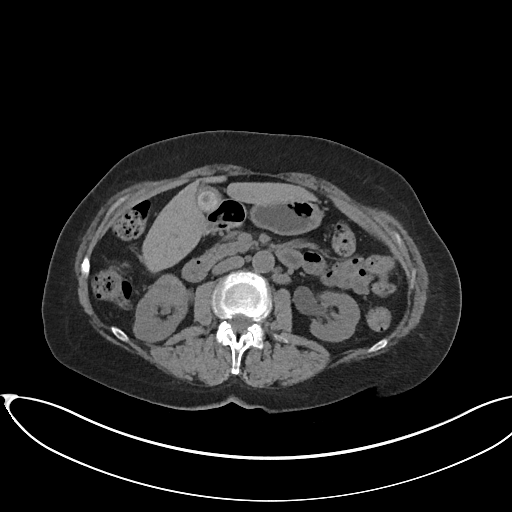
[im 44/86  soft-tissue]
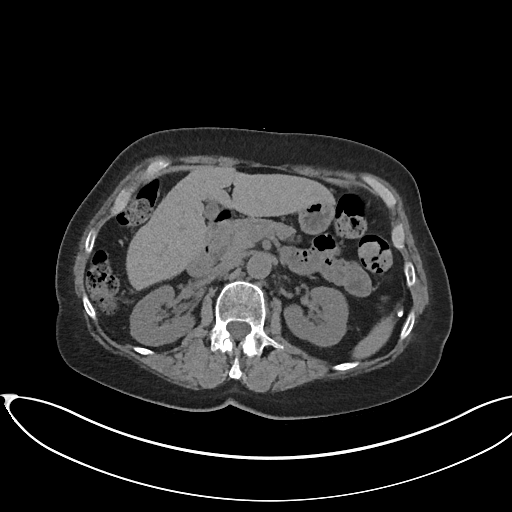
[im 50/86  soft-tissue]
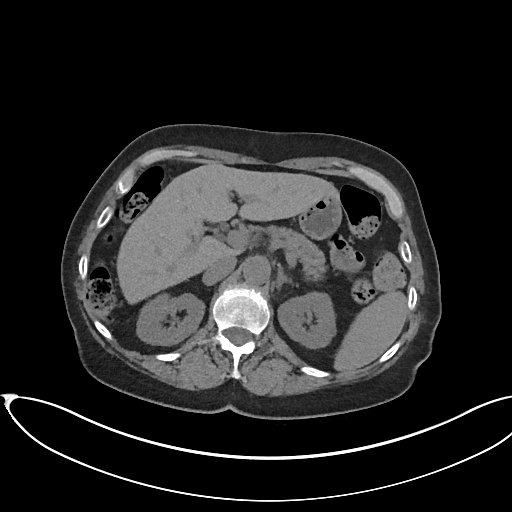
[im 55/86  soft-tissue]
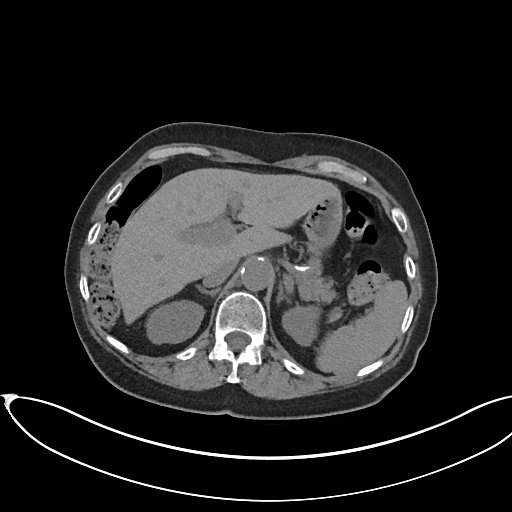
[im 55/86  bone]
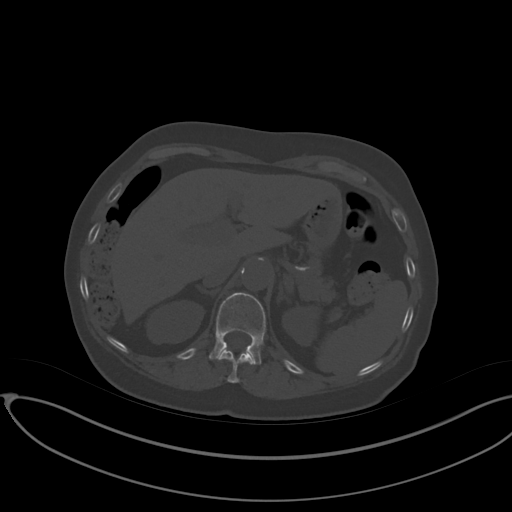
[im 61/86  soft-tissue]
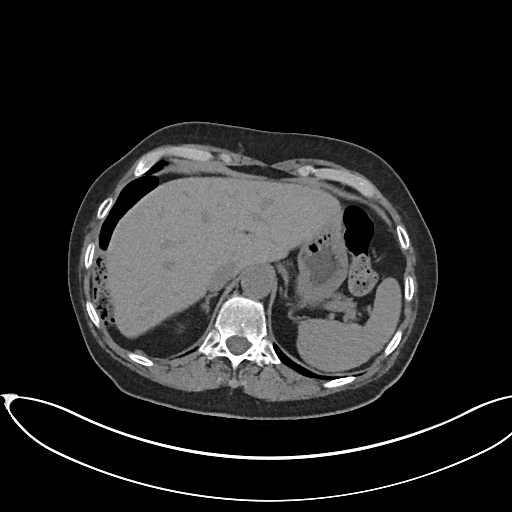
[im 69/86  soft-tissue]
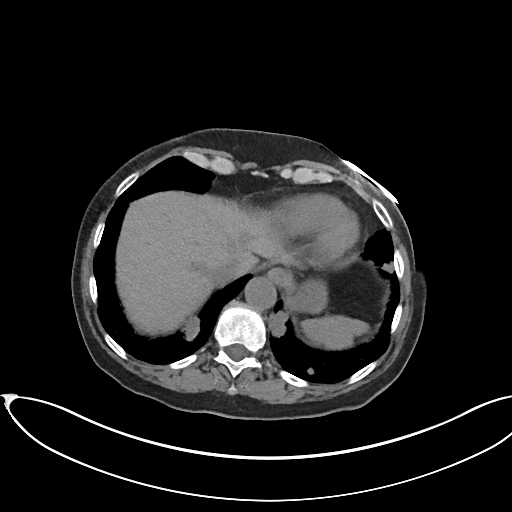
[im 75/86  soft-tissue]
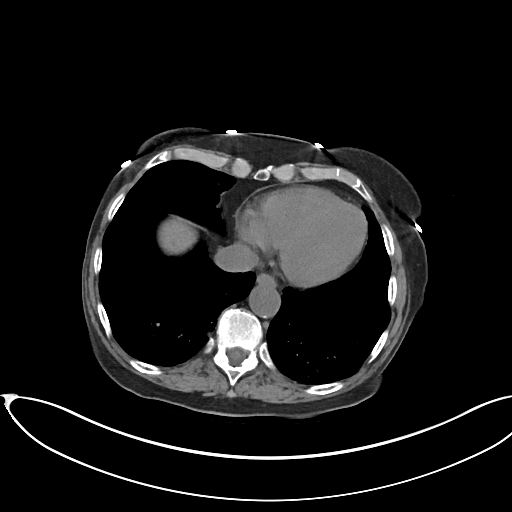
[im 75/86  lung]
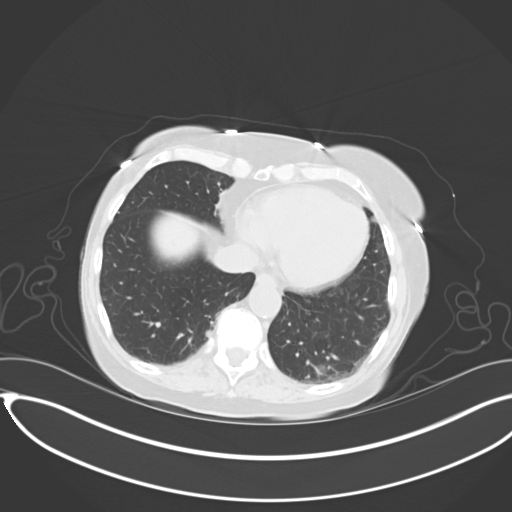
[im 77/86  lung]
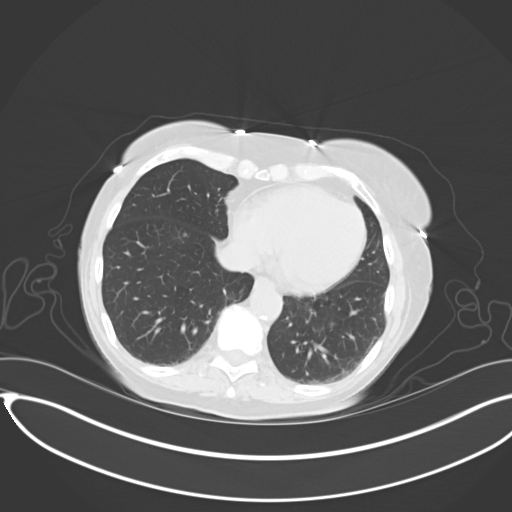
[im 80/86  soft-tissue]
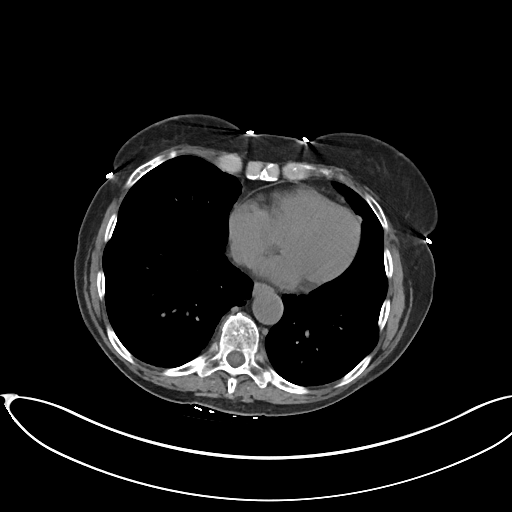
[im 80/86  lung]
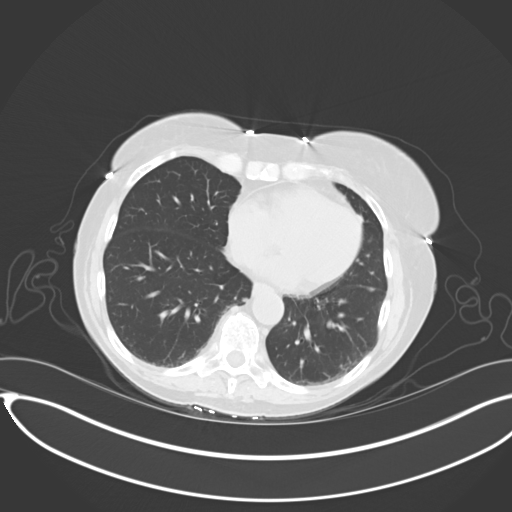
[im 83/86  lung]
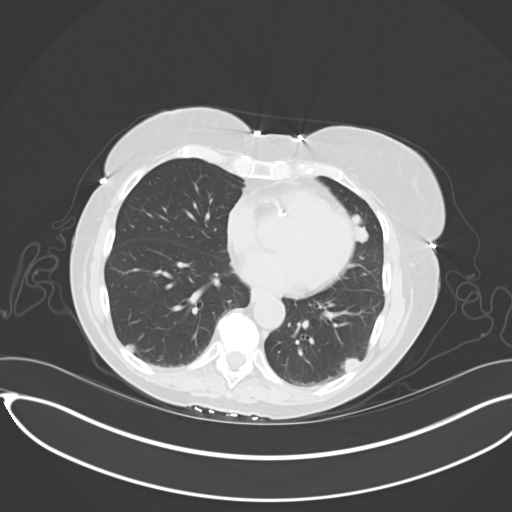

[15 of 32 positions shown; findings below may reference images not displayed]

PROCEDURE:     KCT - KCT ABDOMEN STANDARD W/WO  - January 18, 2011  [DATE]

RESULT:     Axial CT scanning was performed through the abdomen with
reconstructions at 3 mm intervals and slice thicknesses. Due to scanner
malfunction the bolus was suboptimal and the patient was asked to return on
21 January, 2011. On this date the postcontrast and delayed images were
repeated. The patient received 85 cc of [id number].

At lung window settings there are multiple abnormal pulmonary parenchymal
masses which are of soft tissue densities. These are of varying sizes with
the largest measuring just over 2 cm in diameter. I see no pleural effusion.

The right kidney demonstrates an abnormal slightly hyperdense mass in the
anterior aspect of the lower pole of the kidney. It exhibits somewhat
heterogeneous density though portions are hyperdense. It measures
approximately 3 x 2.6 cm in diameter. The Hounsfield measurement is 46 is a
precontrast, a 57 immediately postcontrast, and 66 on delayed images.
Elsewhere the right kidney exhibits normal density. On the left the kidney
exhibits a cortically based hypodensity measuring 7 mm in diameter most
compatible with a cyst in the midpole.

The liver exhibits no focal mass or ductal dilation. There is at least one
large gallstone present. The spleen is not enlarged. The stomach is
nondistended. There are no adrenal masses. The pancreas exhibits no focal
mass. The observed portions of the small and large bowel are normal in
appearance. I do not see periaortic nor pericaval lymphadenopathy. There are
degenerative changes of the L4-L5 or L5-S1 disc with grade 1 anterolisthesis.
IMPRESSION: 1. There is an abnormal appearance of the lower pole of the right kidney
consistent with malignancy.
2. There are multiple abnormal masses the visualized portions of the lower
lungs consistent with metastatic disease.
3. There is at least one large gallstone.

## 2012-10-21 IMAGING — CT CT CHEST W/O CM
1 series · 15 of 33 positions shown, 19 images · non-contrast
Comparison: none

REASON FOR EXAM: Ca of kidney with lung mets Compare to previous CT
COMMENTS:

[Series 2: soft tissue · axial · 0.69mm/px · z∈[-708,-432]mm · 15 of 110 slices shown, 19 images]
[im 9/110  mediastinal]
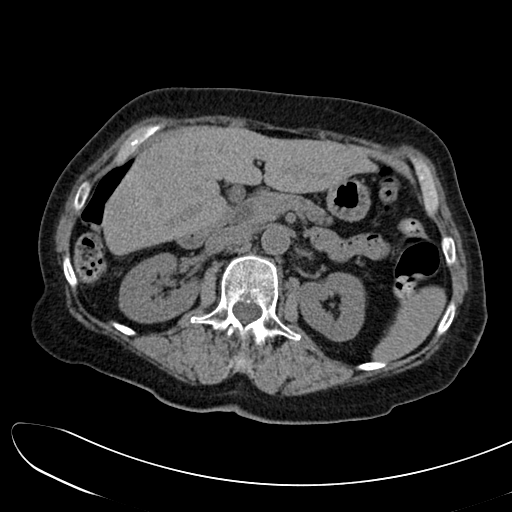
[im 9/110  lung]
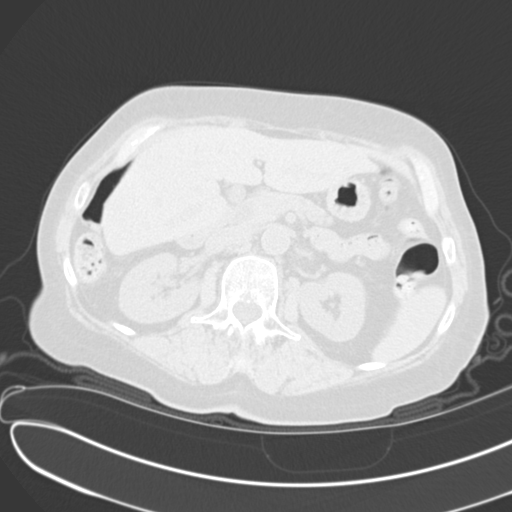
[im 17/110  lung]
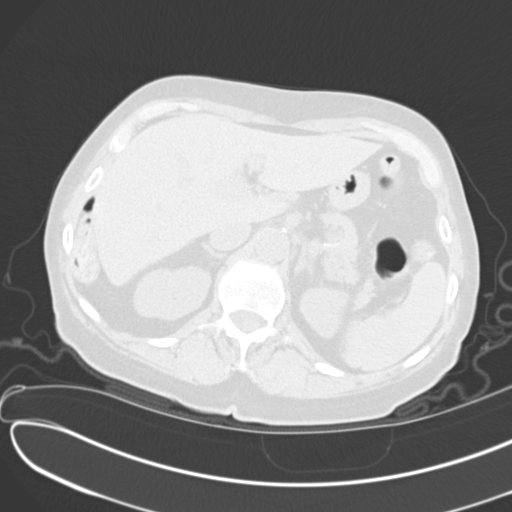
[im 22/110  lung]
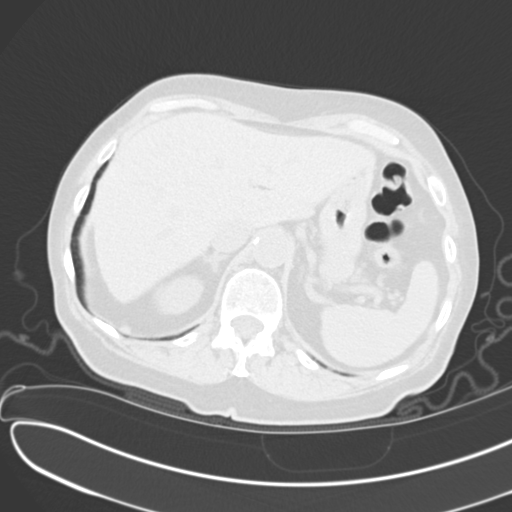
[im 29/110  lung]
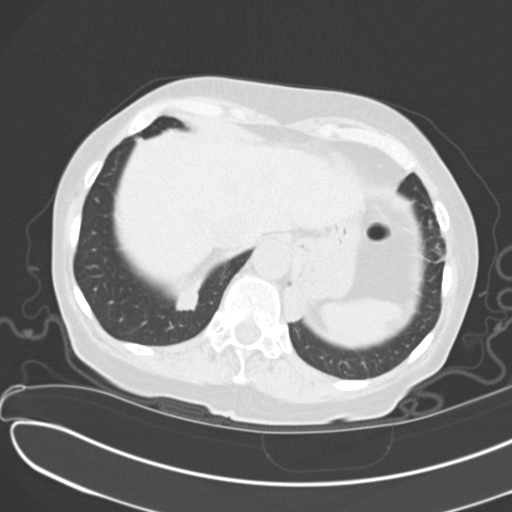
[im 37/110  mediastinal]
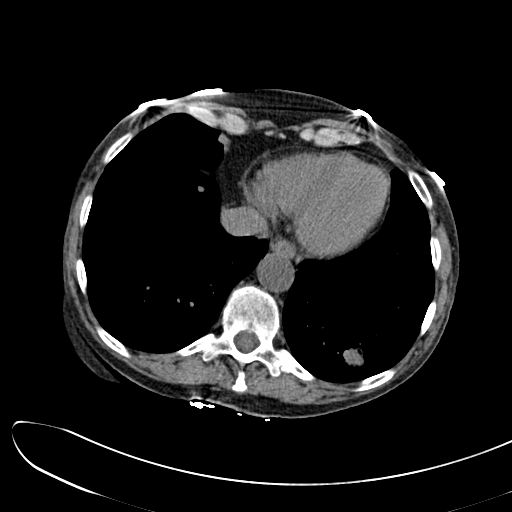
[im 37/110  lung]
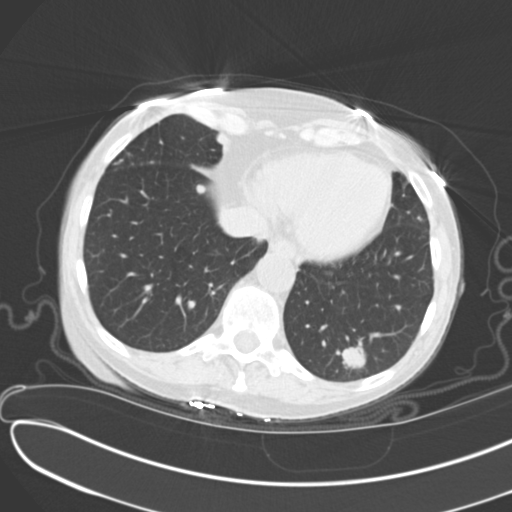
[im 44/110  lung]
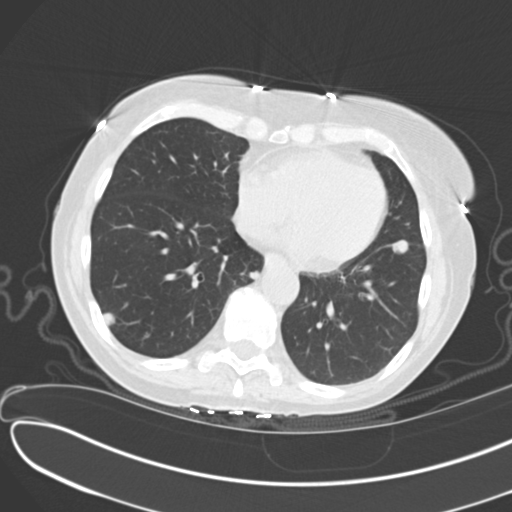
[im 49/110  lung]
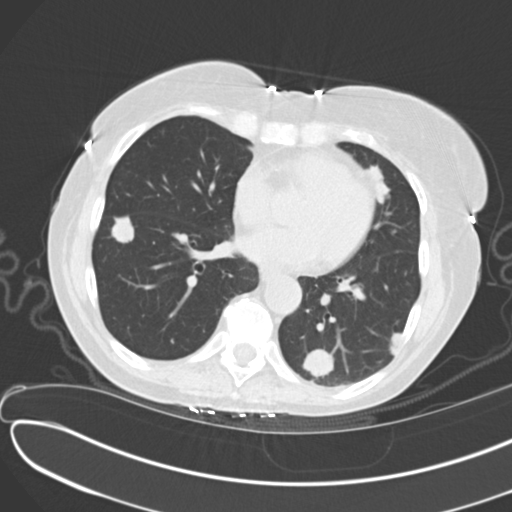
[im 57/110  lung]
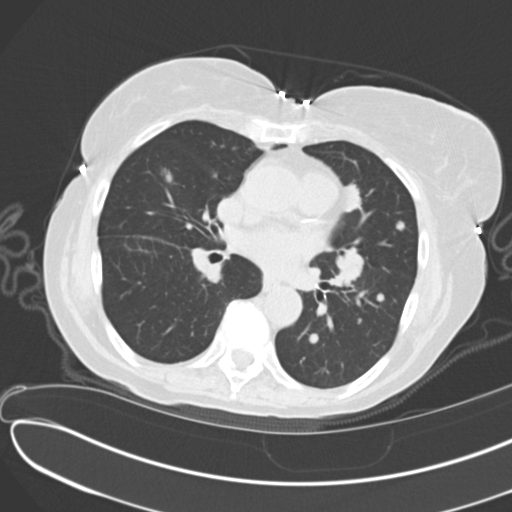
[im 61/110  mediastinal]
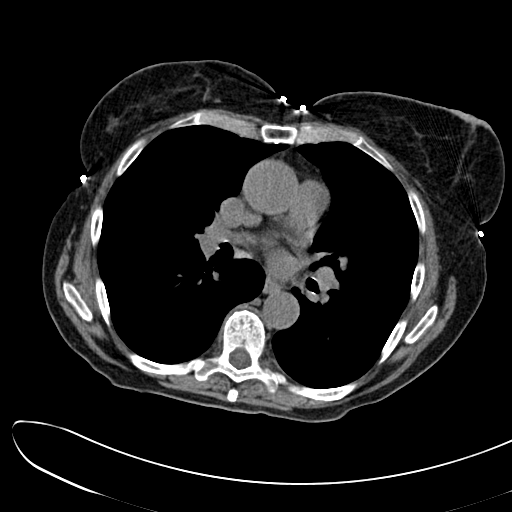
[im 61/110  lung]
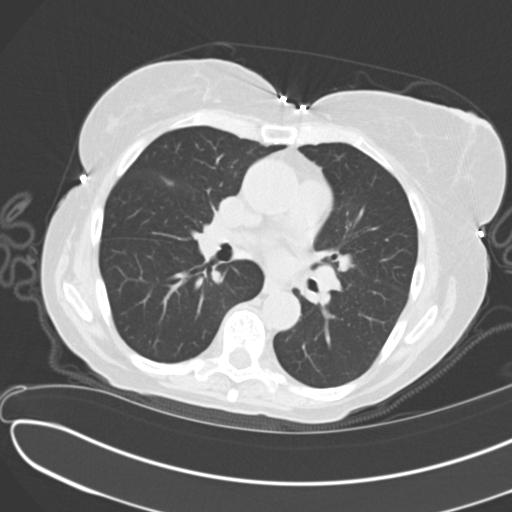
[im 66/110  lung]
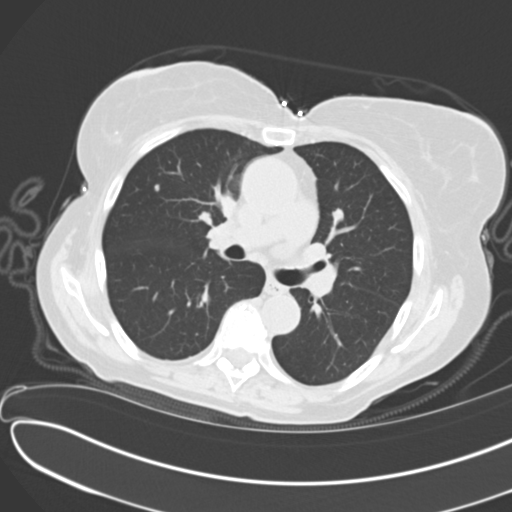
[im 73/110  lung]
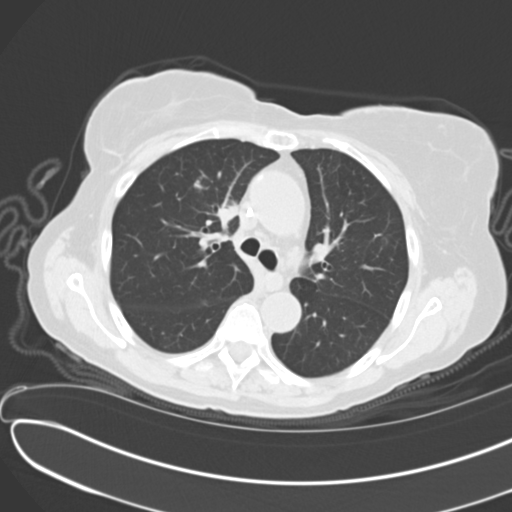
[im 81/110  lung]
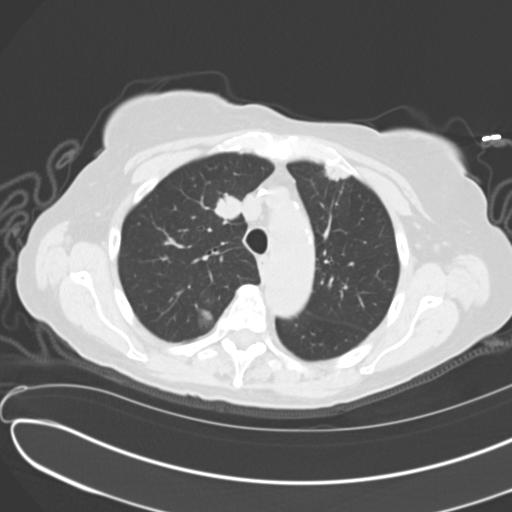
[im 88/110  mediastinal]
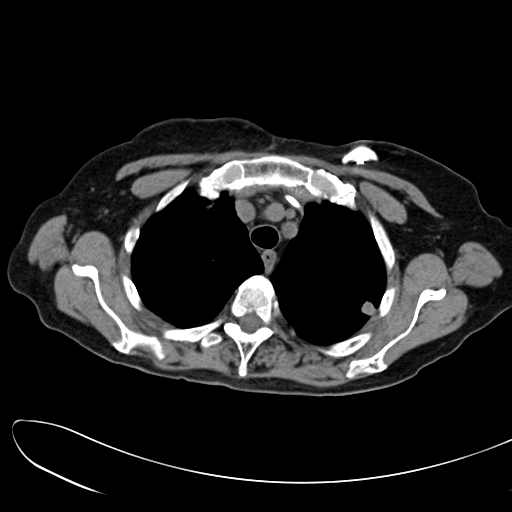
[im 88/110  lung]
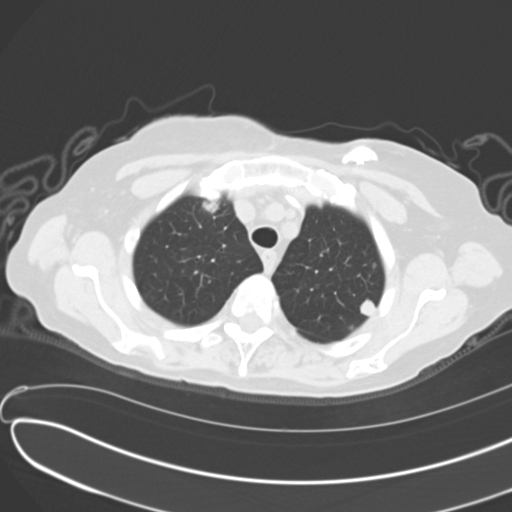
[im 93/110  lung]
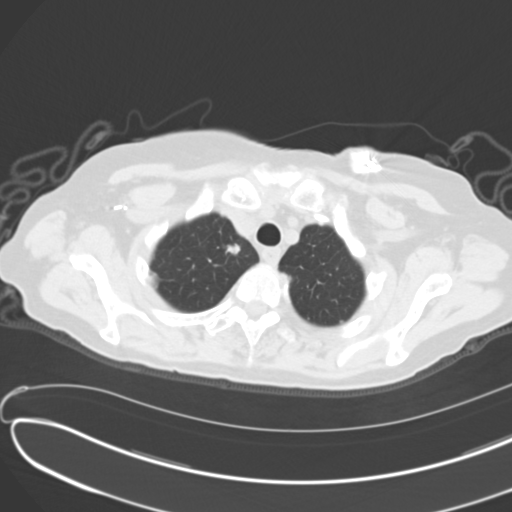
[im 101/110  lung]
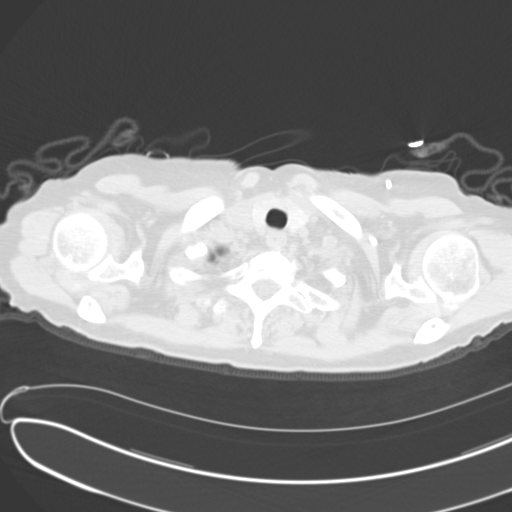

[15 of 33 positions shown; findings below may reference images not displayed]

PROCEDURE:     CT  - CT CHEST WITHOUT CONTRAST  - June 02, 2011 [DATE]

RESULT:     CT of the chest without contrast demonstrates innumerable masses
within both lungs consistent with widespread metastatic disease. Larger
lesions are in the range of 2.0 to 2.3 cm. Smaller lesions are noted to be
approximately 5 mm. The lesions do not appear to be dramatically different
when compared to the previous examination. One abuts the pericardium in the
area of the lingula on image #54. One area on image #35 appears to be
slightly more prominent compared to the previous study where it is seen on
image #20. This measures 6.1 mm anterior to posterior and 5.1 mm medial to
lateral and has increased since the previous study. A lesion anterior to the
major fissure on image #43 in the right lung is also larger in size
currently measuring 7.5 mm in diameter compared to the previous study image
#24 lung window settings which showed a measurement of 4.3 mm. There are
small mediastinal lymph nodes and small hilar lymph nodes present. No
adrenal mass is evident. Noncontrast images of the liver and spleen included
on the study show no definite mass in those structures. Bony structures do
not show a definite mass. No chest wall invasion is evident.
IMPRESSION: 1. Progressive metastatic disease diffusely in the lungs. No pneumothorax is
evident on today's exam.

[REDACTED]

## 2014-05-04 NOTE — Consult Note (Signed)
PATIENT NAME:  Amanda Francis, Amanda Francis MR#:  [id number] DATE OF BIRTH:  1934-01-19  DATE OF CONSULTATION:  02/24/2011  REFERRING PHYSICIAN:   CONSULTING PHYSICIAN:  Audery Amel, MD  IDENTIFYING INFORMATION AND REASON FOR CONSULT: The patient is a 81 year old woman currently in the hospital because of fever in addition to her cancer. Consult for depression.   HISTORY OF PRESENT ILLNESS: Information obtained from the patient and her hospital chart as well as from the chart and my office and my previous knowledge of the patient. This patient is an outpatient of mine whom I've been seeing for many months. I have known her throughout the time that her cancer has been getting treated. Because of her chemotherapy and sickness, she has not been able to come to appointments recently but she has stayed in touch by phone. The patient reports that she is feeling very anxious. Her nerves are bad and she feels frightened. She is able to discuss being frightened in general of dying and also being frightened of what will happen to some of her family who she thinks depend on her when she dies. She is having trouble sleeping at night, feeling very weak and tired, feeling very sad, has some crying spells. She does not report any suicidal intention or plan. She does not report any psychotic symptoms. She is currently being treated with mirtazapine (Remeron) 30 mg at night and her lorazepam is a half mg twice a day. The patient tells me she is concerned that the lorazepam dose is not enough; it's much lower than what she used to take and that it's wearing off before she gets another dose.   PAST PSYCHIATRIC HISTORY: Long history of depression. Denies ever making an actual suicide attempt. Had had some problems with antidepressants in the past getting anxious or feeling oversedated by them. When she came to see me initially over a year ago, she had been prescribed a combination of Adderall and Ativan on a standing basis and wanted  to continue with this. I have continued her on that but have tried to gradually get her to re-add an antidepressant. She was able to tolerate adding Remeron and showed improvement from it at a dose of 45 mg at night. She has obviously been very stressed by her lymphoma and other cancers and medical problems that have been accumulating for the last several months.   PAST MEDICAL HISTORY: Lymphoma, status post chemotherapy. Evidently she has other cancer with metastasis possibly as well. I am not entirely sure I understand all of the situation with her tumor. She has hypertension. She recently had a fever which is what brought her into the hospital.   SOCIAL HISTORY: The patient lives with her sister. She moved to [place] just a little over a year ago to be near her daughter. She has a somewhat conflicted relationship with her daughter. They are close but she worries a lot about her. Also worries a lot about her daughter's son. Not married. Enjoys reading and intellectual pursuits when she can.   REVIEW OF SYSTEMS: Complains of feeling generally anxious with occasional worsening spells of panic. Depressed mood. No psychotic symptoms. No suicidal ideation. Decreased appetite, poor sleep at night.   The patient is a chronically ill looking woman interviewed in her hospital bed. She was cooperative and pleasant during the interview. Made good eye contact. Psychomotor activity a little decreased. Speech easy to understand. Affect is anxious, tearful at times, appropriately so. Mood stated as being upset.  Thoughts are lucid with no evidence of loosening of associations or delusional thinking. Denies suicidal or homicidal ideation. Denies hallucinations. Reasonably good judgment and insight.   ASSESSMENT: This is a 80 year old woman with a history of major depression and chronic anxiety who has been stricken by cancer and has become progressively sick. Through all this she had maintained some improvement in  her depression with her medications. The current hospitalization is obviously another major stress for her. I think that given that she had been stable on 2 mg of Ativan 3 times a day for months and months I don't think it is necessary to really cut down that dramatically. I think she can probably be put up to a higher dose of lorazepam. Given that she is probably nearing the end of her life anyway and that she has not been abusing her medicine, I do not really see any harm in it if it makes her more comfortable. I did warn her that I don't want to oversedate her to where she loses her memory or becomes confused or unable to think clearly which she agrees to. I also think we should increase the Remeron back up to 45 mg a day for full strengths treatment of depression and anxiety. The patient is agreeable.   DIAGNOSES PRINCIPLE AND PRIMARY:  AXIS I: Major depression, recurrent, severe.   SECONDARY DIAGNOSES:  AXIS I: Generalized anxiety disorder.   AXIS II: Deferred.    AXIS III:  1. Lymphoma, solid tumor, with metastasis possibly to the lung. 2. Hypertension.   AXIS IV: Moderate to severe. Acute and chronic stress.   AXIS V: Functioning at time of evaluation 30.   ____________________________ Audery AmelJohn T. Sally-Ann Cutbirth, MD jtc:drc D: 02/24/2011 15:59:21 ET T: 02/24/2011 16:18:53 ET JOB#: [id number]  cc: Audery AmelJohn T. Jissel Slavens, MD, <Dictator> Audery AmelJOHN T Yaretzy Olazabal MD ELECTRONICALLY SIGNED 02/24/2011 17:45

## 2014-05-04 NOTE — Consult Note (Signed)
PATIENT NAME:  Amanda Francis, Amanda Francis MR#:  [id number] DATE OF BIRTH:  1934/01/27  DATE OF CONSULTATION:  02/19/2011  REFERRING PHYSICIAN:   CONSULTING PHYSICIAN:  Knute Neu. Lorre Nick, MD  HISTORY OF PRESENT ILLNESS: Amanda Francis is a 80 year old patient who was admitted earlier this morning with neutropenia and fever. The patient was also seen in the office and evaluated by Dr. Doylene Canning on February 7th. She was having low-grade fevers. She was started on antibiotics orally. She was recommended to come to the hospital which she declined. She spiked a fever up to 101 last evening and had weakness, aches, pains, chills, and also intermittent diarrhea and then did agree to come to the hospital where she was admitted and after cultures were done was given a dose of Rocephin and then started on Zosyn and vancomycin. The patient's significant current history is that following the completion of chemotherapy for lymphoma a number of months ago she developed some lingering slightly progressive cytopenia with neutropenia. Today this morning the patient is in the range of absolute neutropenia at 900 neutrophils. Prior chemotherapy was Rituxan and Treanda.   The patient recently has had a CT scan that shows diffuse pulmonary nodules and a renal mass consistent with potentially recurrent metastatic lymphoma or possible metastatic kidney cancer or other primary.   PAST MEDICAL HISTORY:  1. Hypertension. 2. Hyperlipidemia. 3. Osteoarthritis. 4. Anxiety and depression. 5. Breast cancer with lumpectomy.  6. Hysterectomy.   SOCIAL HISTORY: No alcohol or tobacco.   FAMILY HISTORY: Colon cancer in family members in the mother and prostate cancer.   MEDICATIONS AT HOME: 1. Levaquin for two doses 500 mg daily.  2. Phenergan 25 mg q.6 hours p.r.n.  3. Norco q.6 hours p.r.n. for pain. 4. Ativan b.i.d.  5. Amlodipine 5 mg daily.  6. Lovastatin 20 mg daily.  7. Mirtazapine 30 mg daily.  8. Diflucan 100 mg daily.    ADDITIONAL REVIEW OF SYSTEMS: When I saw the patient earlier in the day, her strength and energy were significantly improved with antibiotics and fluids since admission. Her strength was better. She was afebrile. She has had significant weight loss and admits to significant appetite losses. Had a flat affect but her mood was very bright when I was talking with her. There is cachexia. No jaundice in the sclerae. No thrush in the mouth. Some oral shallow ulcer or stomatitis. No obvious thrush. No vesicles. She had some cough. Body pains were really resolved. These were noted at the recent office visit but this morning is feeling better than she has in a number of days. No current headache or dizziness. No wheezing. No palpitations. No retrosternal chest pain. No focal weakness. No vomiting. She has had intermittent diarrhea that is better. No bruising. No rash. No focal weakness.   PHYSICAL EXAMINATION:   GENERAL: As initially noted, there was no stomatitis. There was no obvious thrush.   NECK: Supple.   LYMPH: There were no palpable lymph nodes in the neck, supraclavicular, or submandibular region.   HEART: Regular.   LUNGS: Clear. No wheezing or rales.   ABDOMEN: Abdomen was not tender, was reported diffusely tender just earlier on the prior admitting exam.   EXTREMITIES: No extremity edema. No calf tenderness.   NEUROLOGIC: Grossly nonfocal. Strength equal in the bed, moving all extremities. Did not test the gait.   LABORATORY, DIAGNOSTIC, AND RADIOLOGICAL DATA: On admission urinalysis was unremarkable. White count showed 900 neutrophils compared to 1300 neutrophils two days earlier. Hemoglobin 10.3.  Platelets 264. Recent CT of the chest just two days earlier showed bilateral numerous pulmonary nodules. Head CT was unremarkable.   IMPRESSION AND PLAN: The patient is with fever now over 101 with just absent neutropenia now. No definite sores but no other obvious viral syndrome. Could  potentially be a GI viral illness but concern over bacterial infection with neutropenia. There is also an anemia and origin of anemia and neutropenia looks like it is related to prior chemotherapy or myelodysplasia but could be bone marrow disease. There is metastatic disease in the lung of unknown origin, lymphoma versus other primary. The liver chemistries are unremarkable. The creatinine is unremarkable.   The patient is currently treated for neutropenic fever. Strength, energy is better, body aches are better, diarrhea has gone, currently afebrile. She is on broad-spectrum antibiotics with cultures pending but no definite source. Has cytopenia likely from previous chemotherapy, possibly from bone marrow involvement from recurrent or second primary. Appears to have renal cancer and may have relapsed lymphoma. Will currently continue antibiotics. Follow the cultures. If the patient remains afebrile for 48 hours and/or if neutrophil count recovers, can switch to p.o. antibiotics. If the neutrophil count falls or the patient does not remain afebrile, could give Neupogen, currently not indicated. Considerations of lung biopsy or bone marrow exam and/or future additional chemotherapy are still pending. These will be pursued when the patient recovers from acute problem.   ____________________________ Knute Neuobert G. Lorre NickGittin, MD rgg:drc D: 02/19/2011 23:37:21 ET T: 02/20/2011 09:46:25 ET JOB#: [id number]  cc: Knute Neuobert G. Lorre NickGittin, MD, <Dictator> Marin RobertsOBERT G GITTIN MD ELECTRONICALLY SIGNED 03/07/2011 14:02

## 2014-05-04 NOTE — H&P (Signed)
PATIENT NAME:  Amanda Francis, Amanda Francis MR#:  [id number] DATE OF BIRTH:  11/24/1934  DATE OF ADMISSION:  02/19/2011  REFERRING PHYSICIAN: Dr. Enedina Finner   PRIMARY CARE PHYSICIAN: Dr. Marcello Fennel    CHIEF COMPLAINT: Fever yesterday, generalized body pain, weakness, and poor oral intake for one week.   HISTORY OF PRESENT ILLNESS: The patient is a 80 year old Caucasian female with a history of lymphoma with lung metastasis, breast cancer status post lumpectomy, and hypertension who presented to the ED with above chief complaint. The patient said she has generalized weakness, body pain, poor oral intake for about one week. She said that she had chemotherapy from January to September last year and developed neutropenia. She went to [facility] and was told she had some infection and has been treated with two antibiotics for two days. She was otherwise to go to the ED if she had a fever. The patient had a fever of 101 yesterday so she came to the ED for further evaluation. The patient also mentioned that she has oral rash and has diffuse abdominal pain and diarrhea intermittently for one week.   PAST MEDICAL HISTORY:  1. Lymphoma with lung metastasis. 2. Breast cancer. 3. Hypertension.   PAST SURGICAL HISTORY:  1. Cesarean section. 2. Right breast lumpectomy.   FAMILY HISTORY: Mother had colon cancer. Father had prostate cancer.   SOCIAL HISTORY: Denies any smoking, drinking, or illicit drugs.  ALLERGIES: Allergic to sulfa.    MEDICATIONS:  1. Amlodipine 5 mg p.o. daily.  2. Ativan 1 mg p.o. b.i.d.  3. Diflucan 100 mg p.o. daily.  4. Levaquin 500 mg p.o. every day for seven days. 5. Lovastatin 20 mg p.o. daily.  6. Mirtazapine 30 mg p.o. at bedtime. 7. Norco 5 mg/325 mg p.o. every six hours p.r.n.  8. Promethazine 25 mg p.o. every six hours p.r.n. for nausea and vomiting.   REVIEW OF SYSTEMS: CONSTITUTIONAL: The patient has fever, fatigue, weakness, generalized body pain, and poor oral intake. EYES: No  double vision or blurry vision. ENT: No discharge from ear or nose. No postnasal drip or epistaxis. RESPIRATORY: Has mild cough but no wheezing, shortness of breath, or hemoptysis. CARDIOVASCULAR: No chest pain, palpitation, or orthopnea. No nocturnal dyspnea. GI: Positive for abdominal pain and diarrhea. No nausea, vomiting, black stool, or bloody stool. GU: The patient has urine frequency and dysuria but no hematuria or incontinence. SKIN: No rash or jaundice. NEUROLOGY: No syncope, loss of consciousness or seizure. ENDOCRINOLOGY: No polydipsia. No heat or cold intolerance.    PHYSICAL EXAMINATION:  VITAL SIGNS: Temperature 98.9, blood pressure 126/62, pulse 78, oxygen saturation 93% on room air.   GENERAL: The patient is alert, awake, oriented in no acute distress but looks tired and depressed.   HEENT: Pupils are round, equal, and reactive to light and accommodation. Moist oral mucosa. Clear oropharynx but has rash.   NECK: Supple. No JVD or carotid bruit. No lymphadenopathy. No thyromegaly.   CARDIOVASCULAR: S1, S2 regular rate and rhythm. No murmurs or gallops.   PULMONARY: Bilateral air entry. No wheezing or rales.   ABDOMEN: Soft. Diffuse tenderness. No rigidity. No rebound. Bowel sounds present. No organomegaly.   EXTREMITIES: No edema, clubbing, or cyanosis. No calf tenderness. Strong bilateral pedal pulses.   NEUROLOGY: Alert and oriented x3. No focal deficits. Power 5 out of 5. Sensation intact. Deep tendon reflexes 2+.   LABORATORY, DIAGNOSTIC, AND RADIOLOGICAL DATA: Urinalysis negative. WBC 1.4, previous one 2.2, hemoglobin 10.3, platelets 264, glucose 109, BUN 6,  creatinine 0.74, potassium 4, sodium 135, calcium 9.5. CT head two days ago no active intracranial process. CT of chest two days ago showed numerous bilateral pulmonary nodules most concerning for metastatic disease.  IMPRESSION:  1. Fever, unknown etiology.  2. Neutropenia.  3. Anemia.  4. Lymphoma with lung  metastasis.  5. History of breast cancer.  6. Hypertension.   PLAN OF TREATMENT:  1. The patient will be placed for observation on medical floor.  2. We will get neutropenia protection and we will start broad-spectrum antibiotics with Zosyn and Vanco.  3. Follow-up blood culture CBC, CRP.  4. We will give IV fluid support and get a dietitian consult for poor oral intake.  5. Continue hypertension medication. 6. We will check stool, Clostridium difficile, and O and P.   7. GI and DVT prophylaxis.   Discussed the current situation and plan of treatment with the patient and the patient's sister.   TIME SPENT: 60 minutes. ____________________________ Shaune PollackQing Danae Oland, MD qc:drc D: 02/19/2011 04:59:33 ET T: 02/19/2011 08:54:12 ET JOB#: [id number] cc: Shaune PollackQing Halley Kincer, MD, <Dictator>, Barbette ReichmannVishwanath Hande, MD Shaune PollackQING Nykayla Marcelli MD ELECTRONICALLY SIGNED 02/21/2011 19:11

## 2014-05-04 NOTE — Discharge Summary (Signed)
PATIENT NAME:  Amanda Francis, Amanda Francis MR#:  [id number] DATE OF BIRTH:  Feb 18, 1934  DATE OF ADMISSION:  02/19/2011 DATE OF DISCHARGE:  02/28/2011  DISCHARGE DIAGNOSES:  1. Neutropenia.  2. Metastatic renal cell cancer to lung. 3. Hypertension.  4. Anemia.  5. Anxiety and depression.  6. History of lymphoma. 7. History of breast cancer.   CHIEF COMPLAINT: Fever, generalized body ache, weakness, and decreased p.o. intake.   HISTORY OF PRESENT ILLNESS: Amanda Francis is a 80 year old female with a history of lymphoma with lung mets, history of breast cancer status post lumpectomy, and hypertension who presented to the Emergency Room complaining of fever associated with generalized body ache, weakness, and decreased p.o. intake for about one week. The patient had chemotherapy from January to September of 2012 and developed neutropenia, in fact she had been on antibiotics for a couple of days, but subsequently she developed a fever of 101. The patient also complained of diffuse abdominal pain associated with diarrhea.   PAST MEDICAL HISTORY:  1. Lymphoma with lung mets. 2. History of breast cancer. 3. History of hypertension.   PAST SURGICAL HISTORY:  1. Cesarean section. 2. Right breast lumpectomy.   PHYSICAL EXAMINATION: On physical examination, her temperature was 98.9, blood pressure 126/62, pulse was 78, and oxygen saturation 93% on room air. She appeared depressed and anxious. HEENT revealed pupils were equal and reactive to light. Oropharynx was clear. Neck was supple. No JVD. Heart with S1 and S2. Lungs were clear to auscultation. Abdomen was soft and nontender. Extremities had no edema. Neurologically nonfocal.   LABS/STUDIES: WBC count 1.4, hemoglobin 10.3, and platelets 264. Glucose 109, BUN 6, creatinine 0.74, potassium 4, sodium 135, and calcium 9.5.   CT of the chest had shown evidence of pulmonary nodules concerning for metastatic disease.   HOSPITAL COURSE: The patient was admitted  to [facility] and received IV Zosyn and vancomycin. She was also seen in consultation by Dr. Forest Gleason, who is an oncologist, and subsequently antibiotics were switched to [place]. She became afebrile. She was noted to have leukopenia and was started on Neupogen. However, her white cell count continued to remain low at 1.2, hemoglobin was 9.9, hematocrit 29.5, and platelet count was 200. She was also seen by a psychiatrist, Dr. Alethia Berthold, because of problems with depression and anxiety, and her dose of Remeron was increased from 30 mg to 45 mg a day. Her Clostridium difficile toxin was negative. Stool cultures did not show any Campylobacter, Salmonella, or Shigella. No white cells were seen. Blood cultures were negative. She did undergo a bone marrow biopsy and also underwent a CT-guided biopsy of the lung nodule which showed evidence of clear-cell carcinoma consistent with metastatic renal cell carcinoma, clear cell type. Discussions were held with the family and the patient was keen to proceed with targeted therapy for this. Dr. Forest Gleason will followup her up as an outpatient with further management plans. The patient also had an episode of chest pain, which resolved spontaneously. She was stable at the time of discharge, and she was discharged home.  DISCHARGE MEDICATIONS: 1. Mirtazapine 45 mg p.o. at bedtime.  2. Lovastatin 20 mg a day.  3. Amlodipine 5 mg a day.  4. Ativan 1 mg a day.  5. Norco 5/325 mg one tablet p.o. every six hours p.r.n. for pain. 6. Promethazine 25 mg every 6 hours p.r.n. for nausea and vomiting.          DISCHARGE INSTRUCTIONS:  The patient has been advised to have a CBC and metabolic panel in one week and to follow up with me in the clinic, Dr. Tracie Harrier, and also with Dr. Forest Gleason. ____________________________ Tracie Harrier, MD vh:slb D: 03/01/2011 19:12:02 ET T: 03/02/2011 10:10:47  ET JOB#: [id number]  cc: Tracie Harrier, MD, <Dictator> Tracie Harrier MD ELECTRONICALLY SIGNED 03/04/2011 15:29
